# Patient Record
Sex: Male | Born: 1965 | Race: Black or African American | Hispanic: No | Marital: Single | Smoking: Never smoker
Health system: Southern US, Community
[De-identification: ages and names within clinical notes are randomized; demographics above are authoritative.]

## PROBLEM LIST (undated history)

## (undated) DIAGNOSIS — I10 Essential (primary) hypertension: Secondary | ICD-10-CM

## (undated) DIAGNOSIS — R9431 Abnormal electrocardiogram [ECG] [EKG]: Secondary | ICD-10-CM

## (undated) DIAGNOSIS — N183 Chronic kidney disease, stage 3 unspecified: Secondary | ICD-10-CM

## (undated) HISTORY — DX: Essential (primary) hypertension: I10

## (undated) HISTORY — DX: Chronic kidney disease, stage 3 unspecified: N18.30

## (undated) HISTORY — DX: Chronic kidney disease, stage 3 (moderate): N18.3

## (undated) HISTORY — DX: Abnormal electrocardiogram (ECG) (EKG): R94.31

---

## 2011-06-27 ENCOUNTER — Encounter (HOSPITAL_BASED_OUTPATIENT_CLINIC_OR_DEPARTMENT_OTHER): Payer: Self-pay | Admitting: *Deleted

## 2011-06-27 ENCOUNTER — Emergency Department (INDEPENDENT_AMBULATORY_CARE_PROVIDER_SITE_OTHER): Payer: Self-pay

## 2011-06-27 ENCOUNTER — Emergency Department (HOSPITAL_BASED_OUTPATIENT_CLINIC_OR_DEPARTMENT_OTHER)
Admission: EM | Admit: 2011-06-27 | Discharge: 2011-06-27 | Disposition: A | Discharge status: Home / Self Care | Payer: Self-pay | Attending: Emergency Medicine | Admitting: Emergency Medicine

## 2011-06-27 ENCOUNTER — Other Ambulatory Visit: Payer: Self-pay

## 2011-06-27 DIAGNOSIS — R002 Palpitations: Secondary | ICD-10-CM

## 2011-06-27 LAB — DIFFERENTIAL
Basophils Absolute: 0 10*3/uL (ref 0.0–0.1)
Lymphocytes Relative: 26 % (ref 12–46)
Lymphs Abs: 1.4 10*3/uL (ref 0.7–4.0)
Monocytes Absolute: 0.4 10*3/uL (ref 0.1–1.0)
Neutro Abs: 3.4 10*3/uL (ref 1.7–7.7)

## 2011-06-27 LAB — CBC
HCT: 41.5 % (ref 39.0–52.0)
RBC: 5.86 MIL/uL — ABNORMAL HIGH (ref 4.22–5.81)
RDW: 15.9 % — ABNORMAL HIGH (ref 11.5–15.5)
WBC: 5.3 10*3/uL (ref 4.0–10.5)

## 2011-06-27 LAB — COMPREHENSIVE METABOLIC PANEL
ALT: 21 U/L (ref 0–53)
AST: 28 U/L (ref 0–37)
CO2: 28 mEq/L (ref 19–32)
Chloride: 100 mEq/L (ref 96–112)
Creatinine, Ser: 1.4 mg/dL — ABNORMAL HIGH (ref 0.50–1.35)
GFR calc non Af Amer: 60 mL/min — ABNORMAL LOW (ref >90)
Glucose, Bld: 96 mg/dL (ref 70–99)
Sodium: 137 mEq/L (ref 135–145)
Total Bilirubin: 0.6 mg/dL (ref 0.3–1.2)

## 2011-06-27 LAB — CARDIAC PANEL(CRET KIN+CKTOT+MB+TROPI): Total CK: 289 U/L — ABNORMAL HIGH (ref 7–232)

## 2011-06-27 NOTE — ED Provider Notes (Signed)
History     CSN: [id number]  Arrival date & time 06/27/11  1704   First MD Initiated Contact with Patient 06/27/11 1737      No chief complaint on file.   (Consider location/radiation/quality/duration/timing/severity/associated sxs/prior treatment) Patient is a 46 y.o. male presenting with palpitations. The history is provided by the patient. No language interpreter was used.  Palpitations  This is a recurrent problem. The current episode started 2 days ago. The problem occurs constantly. The problem has not changed since onset.The problem is associated with an unknown factor. Associated symptoms include irregular heartbeat. Pertinent negatives include no numbness, no chest pain, no chest pressure and no exertional chest pressure. He has tried nothing for the symptoms. Risk factors include no known risk factors. His past medical history does not include hyperthyroidism.  Pt reports he heart was racing on Saturday.  Pt reports he had a similar episode a month ago.  Pt reports no problem today.  Pt reports he was thinking about Saturdays episode and decided he should get checked out.  History reviewed. No pertinent past medical history.  History reviewed. No pertinent past surgical history.  History reviewed. No pertinent family history.  History  Substance Use Topics  . Smoking status: Never Smoker   . Smokeless tobacco: Not on file  . Alcohol Use: Yes     occ      Review of Systems  Cardiovascular: Positive for palpitations. Negative for chest pain.  Neurological: Negative for numbness.  All other systems reviewed and are negative.    Allergies  Review of patient's allergies indicates no known allergies.  Home Medications  No current outpatient prescriptions on file.  BP 169/119  Temp(Src) 98.6 F (37 C) (Oral)  Resp 18  Wt 238 lb (107.956 kg)  SpO2 99%  Physical Exam  Nursing note and vitals reviewed. Constitutional: He is oriented to person, place, and time.  He appears well-developed and well-nourished.  HENT:  Head: Normocephalic and atraumatic.  Right Ear: External ear normal.  Left Ear: External ear normal.  Nose: Nose normal.  Mouth/Throat: Oropharynx is clear and moist.  Eyes: Conjunctivae and EOM are normal. Pupils are equal, round, and reactive to light.  Neck: Normal range of motion. Neck supple.  Cardiovascular: Normal rate and normal heart sounds.   Pulmonary/Chest: Effort normal and breath sounds normal.  Abdominal: Soft.  Musculoskeletal: Normal range of motion.  Neurological: He is alert and oriented to person, place, and time. He has normal reflexes.  Skin: Skin is warm.  Psychiatric: He has a normal mood and affect.    ED Course  Procedures (including critical care time)  Labs Reviewed  CBC - Abnormal; Notable for the following:    RBC 5.86 (*)    MCV 70.8 (*)    MCH 23.5 (*)    RDW 15.9 (*)    All other components within normal limits  COMPREHENSIVE METABOLIC PANEL - Abnormal; Notable for the following:    Creatinine, Ser 1.40 (*)    GFR calc non Af Amer 60 (*)    GFR calc Af Amer 70 (*)    All other components within normal limits  CARDIAC PANEL(CRET KIN+CKTOT+MB+TROPI) - Abnormal; Notable for the following:    Total CK 289 (*)    CK, MB 4.9 (*)    All other components within normal limits  DIFFERENTIAL   Dg Chest 2 View  06/27/2011  *RADIOLOGY REPORT*  Clinical Data: Palpitations.  CHEST - 2 VIEW  Comparison: None.  Findings: The heart size is normal.  The lungs are clear.  The visualized soft tissues and bony thorax are unremarkable.  IMPRESSION: No acute cardiopulmonary disease.  Original Report Authenticated By: Jamesetta Orleans. MATTERN, M.D.     No diagnosis found.    MDM  Pt observed on monitor x 30 minutes,  Occasional pvc.     Results for orders placed during the hospital encounter of 06/27/11  CBC      Component Value Range   WBC 5.3  4.0 - 10.5 (K/uL)   RBC 5.86 (*) 4.22 - 5.81 (MIL/uL)    Hemoglobin 13.8  13.0 - 17.0 (g/dL)   HCT 40.9  81.1 - 91.4 (%)   MCV 70.8 (*) 78.0 - 100.0 (fL)   MCH 23.5 (*) 26.0 - 34.0 (pg)   MCHC 33.3  30.0 - 36.0 (g/dL)   RDW 78.2 (*) 95.6 - 15.5 (%)   Platelets 273  150 - 400 (K/uL)  DIFFERENTIAL      Component Value Range   Neutrophils Relative 64  43 - 77 (%)   Neutro Abs 3.4  1.7 - 7.7 (K/uL)   Lymphocytes Relative 26  12 - 46 (%)   Lymphs Abs 1.4  0.7 - 4.0 (K/uL)   Monocytes Relative 8  3 - 12 (%)   Monocytes Absolute 0.4  0.1 - 1.0 (K/uL)   Eosinophils Relative 2  0 - 5 (%)   Eosinophils Absolute 0.1  0.0 - 0.7 (K/uL)   Basophils Relative 1  0 - 1 (%)   Basophils Absolute 0.0  0.0 - 0.1 (K/uL)  COMPREHENSIVE METABOLIC PANEL      Component Value Range   Sodium 137  135 - 145 (mEq/L)   Potassium 3.7  3.5 - 5.1 (mEq/L)   Chloride 100  96 - 112 (mEq/L)   CO2 28  19 - 32 (mEq/L)   Glucose, Bld 96  70 - 99 (mg/dL)   BUN 16  6 - 23 (mg/dL)   Creatinine, Ser 2.13 (*) 0.50 - 1.35 (mg/dL)   Calcium 9.6  8.4 - 08.6 (mg/dL)   Total Protein 8.0  6.0 - 8.3 (g/dL)   Albumin 4.3  3.5 - 5.2 (g/dL)   AST 28  0 - 37 (U/L)   ALT 21  0 - 53 (U/L)   Alkaline Phosphatase 80  39 - 117 (U/L)   Total Bilirubin 0.6  0.3 - 1.2 (mg/dL)   GFR calc non Af Amer 60 (*) >90 (mL/min)   GFR calc Af Amer 70 (*) >90 (mL/min)  CARDIAC PANEL(CRET KIN+CKTOT+MB+TROPI)      Component Value Range   Total CK 289 (*) 7 - 232 (U/L)   CK, MB 4.9 (*) 0.3 - 4.0 (ng/mL)   Troponin I <0.30  <0.30 (ng/mL)   Relative Index 1.7  0.0 - 2.5    Dg Chest 2 View  06/27/2011  *RADIOLOGY REPORT*  Clinical Data: Palpitations.  CHEST - 2 VIEW  Comparison: None.  Findings: The heart size is normal.  The lungs are clear.  The visualized soft tissues and bony thorax are unremarkable.  IMPRESSION: No acute cardiopulmonary disease.  Original Report Authenticated By: Jamesetta Orleans. MATTERN, M.D.    Date: 06/27/2011  Rate: 75  Rhythm: normal sinus rhythm  QRS Axis: normal  Intervals:  normal  ST/T Wave abnormalities: normal  Conduction Disutrbances:none  Narrative Interpretation:   Old EKG Reviewed: none available      Langston Masker, [place] 06/27/11 [phone]

## 2011-06-27 NOTE — ED Provider Notes (Signed)
Medical screening examination/treatment/procedure(s) were performed by non-physician practitioner and as supervising physician I was immediately available for consultation/collaboration.   Rolan Bucco, MD 06/27/11 2312

## 2013-07-16 IMAGING — CR DG CHEST 2V
2 series · 2 of 2 positions shown · non-contrast
Comparison: None.

CLINICAL DATA: Palpitations.

CHEST - 2 VIEW

[w chest pa]
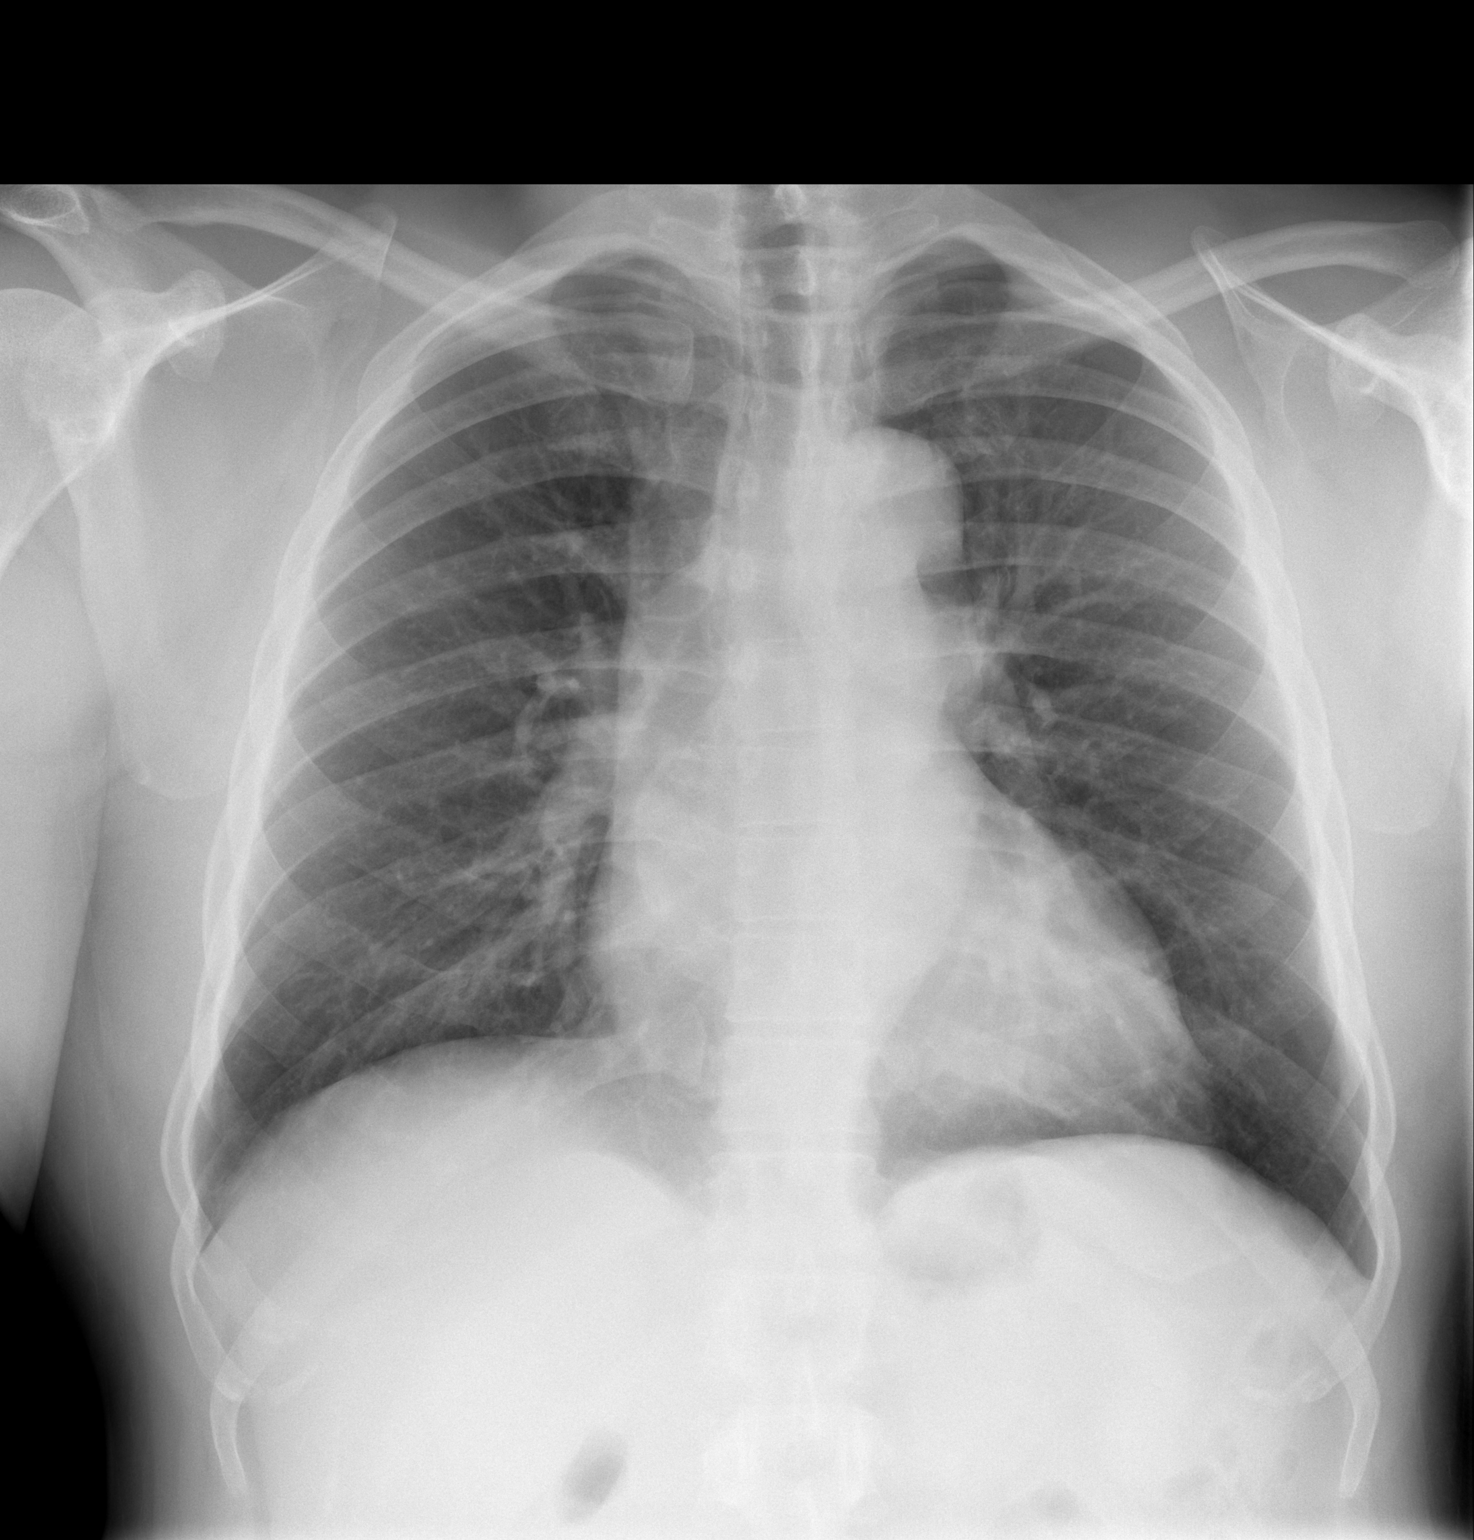

[w chest lat]
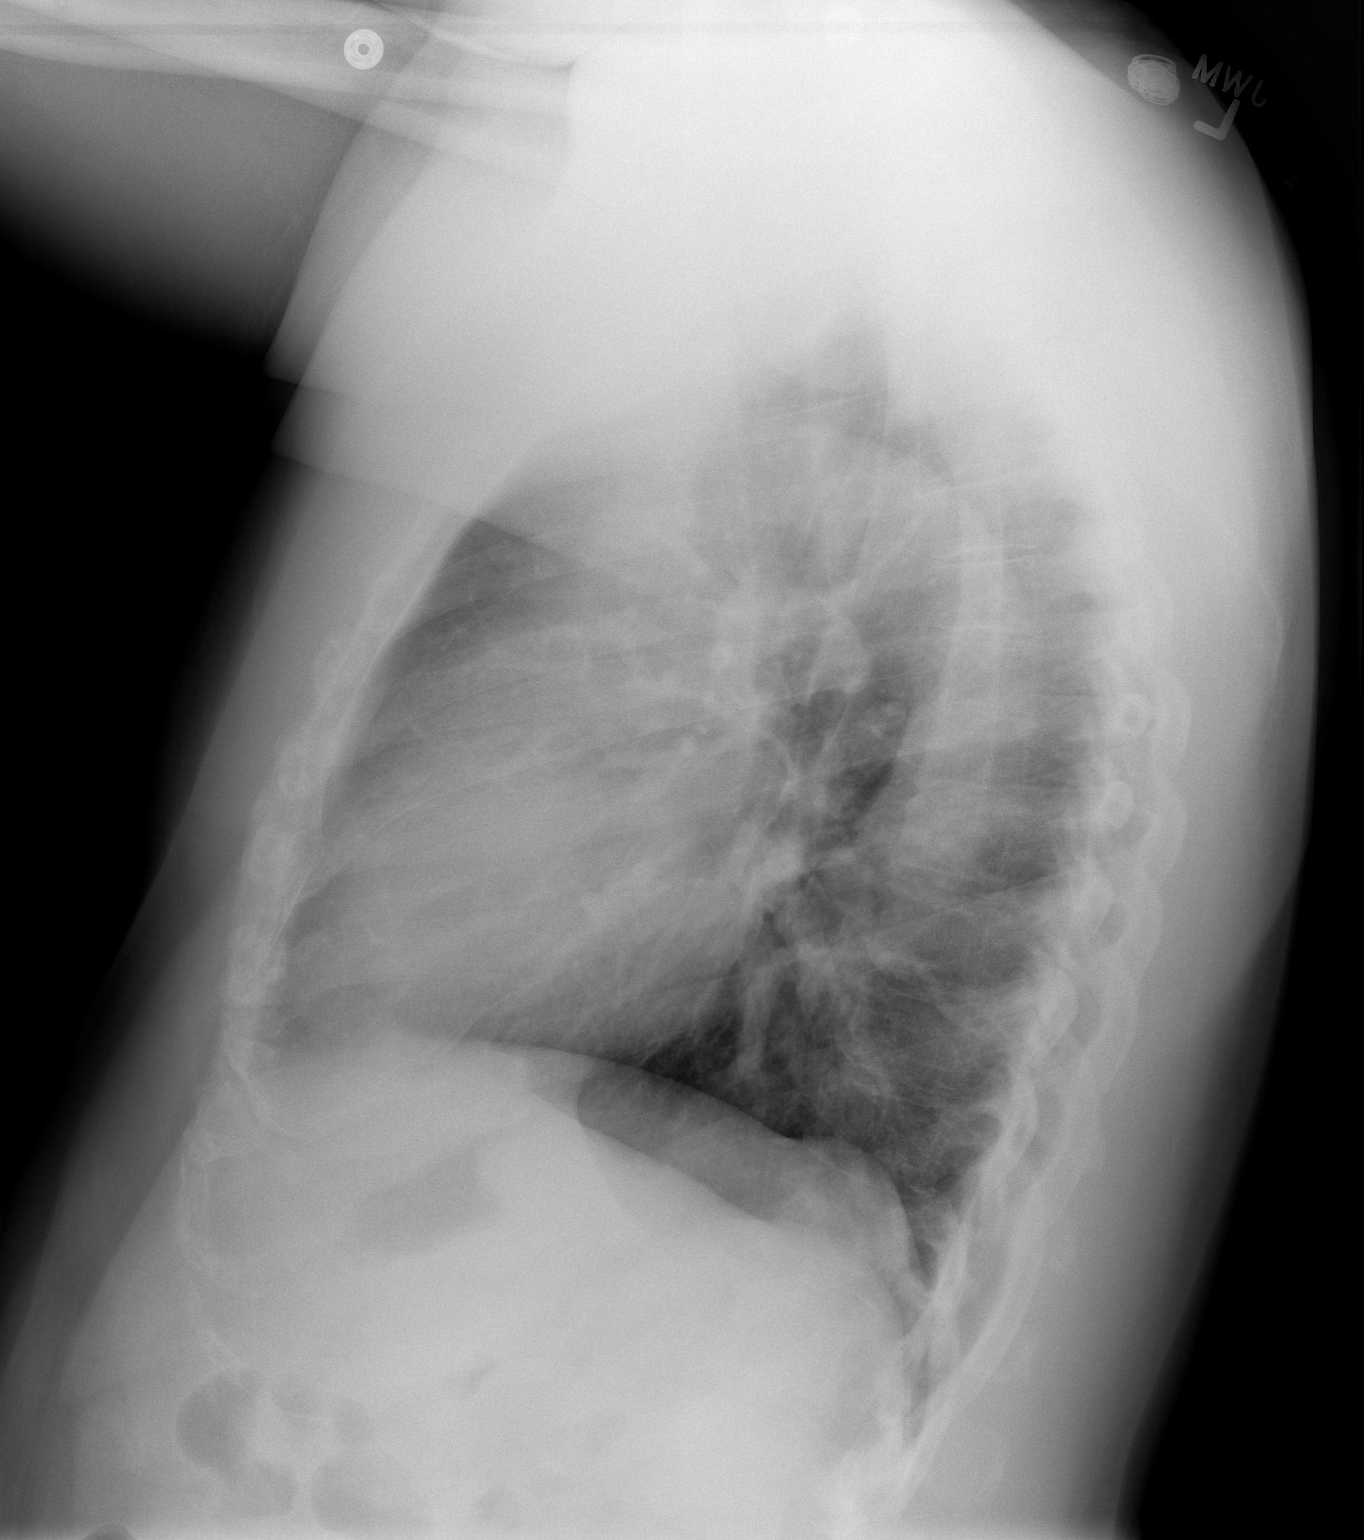

[2 of 2 positions shown; findings below may reference images not displayed]

FINDINGS: The heart size is normal.  The lungs are clear.  The
visualized soft tissues and bony thorax are unremarkable.
IMPRESSION: No acute cardiopulmonary disease.

## 2014-05-02 ENCOUNTER — Ambulatory Visit: Payer: Self-pay | Admitting: Interventional Cardiology

## 2014-05-06 ENCOUNTER — Ambulatory Visit (INDEPENDENT_AMBULATORY_CARE_PROVIDER_SITE_OTHER): Payer: No Typology Code available for payment source | Admitting: Cardiology

## 2014-05-06 ENCOUNTER — Ambulatory Visit: Payer: Self-pay | Admitting: Cardiology

## 2014-05-06 ENCOUNTER — Encounter: Payer: Self-pay | Admitting: Cardiology

## 2014-05-06 VITALS — BP 152/82 | HR 72 | Ht 69.5 in | Wt 234.0 lb

## 2014-05-06 DIAGNOSIS — R9431 Abnormal electrocardiogram [ECG] [EKG]: Secondary | ICD-10-CM

## 2014-05-06 DIAGNOSIS — N189 Chronic kidney disease, unspecified: Secondary | ICD-10-CM | POA: Insufficient documentation

## 2014-05-06 DIAGNOSIS — I10 Essential (primary) hypertension: Secondary | ICD-10-CM

## 2014-05-06 DIAGNOSIS — R0602 Shortness of breath: Secondary | ICD-10-CM

## 2014-05-06 DIAGNOSIS — R079 Chest pain, unspecified: Secondary | ICD-10-CM | POA: Insufficient documentation

## 2014-05-06 MED ORDER — AMLODIPINE BESYLATE 10 MG PO TABS: 10.0000 mg | ORAL_TABLET | Freq: Every day | ORAL | Status: AC

## 2014-05-06 MED ORDER — AMLODIPINE BESYLATE 10 MG PO TABS: 10.0000 mg | ORAL_TABLET | Freq: Every day | ORAL | Status: DC

## 2014-05-06 NOTE — Progress Notes (Signed)
[address] [place], [place]  [address] Phone: [phone] Fax:  [phone]  Date:  05/06/2014   ID:  Justin Mccann, DOB June 18, 1965, MRN [id number]  PCP:  Gretel AcreNNODI, ADAKU, MD  Cardiologist:  Armanda Magicraci Turner, MD    History of Present Illness: Justin Mccann is a 48 y.o. male with a history of HTN and abnormal EKG who presents today for evaluation.  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  He feels these symptoms also when his  BP is elevated and EKG showed T wave inversions in the lateral leads.  He is now referred for further evaluation.  He denies any  LE edema, dizziness or syncope.  He occasoinally will have some palpitations.  He says that he snores but never had any witnessed apnea.  He does have some nonrestorative sleep but a lot of times only gets 4 hours of sleep.     Wt Readings from Last 3 Encounters:  05/06/14 234 lb (106.142 kg)  06/27/11 238 lb (107.956 kg)     Past Medical History  Diagnosis Date  . Hypertension   . T wave inversion in EKG   . Chronic kidney disease (CKD), stage III (moderate)     Current Outpatient Prescriptions  Medication Sig Dispense Refill  . amLODipine (NORVASC) 5 MG tablet Take 5 mg by mouth daily.    . Beet Root POWD by Does not apply route. Super beet concentrated beetroot crystals  By mouth daily    . hydrochlorothiazide (HYDRODIURIL) 25 MG tablet Take 25 mg by mouth daily.    . Omega-3 Fatty Acids (FISH OIL PO) Take by mouth.     No current facility-administered medications for this visit.    Allergies:   No Known Allergies  Social History:  The patient  reports that he has never smoked. He does not have any smokeless tobacco history on file. He reports that he drinks alcohol. He reports that he does not use illicit drugs.   Family History:  The patient's family history includes Hypertension in his mother.    ROS:  Please see the history of present illness.      All other systems reviewed and negative.   PHYSICAL EXAM: VS:  BP 152/82 mmHg  Pulse 72  Ht 5' 9.5" (1.765 m)  Wt 234 lb (106.142 kg)  BMI 34.07 kg/m2  SpO2 94% Well nourished, well developed, in no acute distress HEENT: normal Neck: no JVD Cardiac:  normal S1, S2; RRR; no murmur Lungs:  clear to auscultation bilaterally, no wheezing, rhonchi or rales Abd: soft, nontender, no hepatomegaly Ext: no edema Skin: warm and dry Neuro:  CNs 2-12 intact, no focal abnormalities noted  EKG:  NSR with T wave inversions in I, aVL, V5 and V6     ASSESSMENT AND PLAN:  1. Chest pain that mainly occurs in the setting of elevated BPs.  This could be ischemic related given T wave changes on EKG but these same changes can be seen in LVH with repolarization so the chest pain could also be due to subendocardial ischemia from poorly controlled HTN.  I will check a stress myoview to rule out ischemia.   2. HTN - still with borderline control.  Increase amlodipine to 10 mg daily.  I have asked him to check his BP daily for a week and call with the results.  Continue HCTZ 3. SOB ? Related to uncontrolled HTN vs. Coronary ischemia.  I will check an echo to evaluate LVF  Followup with me in 3 weeks  Signed, Armanda Magicraci Turner, MD [facility] 05/06/2014 4:21 PM

## 2014-05-06 NOTE — Patient Instructions (Addendum)
Your physician has requested that you have an echocardiogram. Echocardiography is a painless test that uses sound waves to create images of your heart. It provides your doctor with information about the size and shape of your heart and how well your heart's chambers and valves are working. This procedure takes approximately one hour. There are no restrictions for this procedure.  Your physician has requested that you have a STRESS myoview. For further information please visit [email or web address]. Please follow instruction sheet, as given.  Your physician has recommended you make the following change in your medication:  1) INCREASE amlodipine to 10 mg daily  Please check your blood pressure daily for one week and call [phone] with results.   Your physician recommends that you schedule a follow-up appointment in: 4 weeks with Dr. Mayford Knifeurner.

## 2014-05-13 ENCOUNTER — Ambulatory Visit (HOSPITAL_BASED_OUTPATIENT_CLINIC_OR_DEPARTMENT_OTHER): Payer: No Typology Code available for payment source | Admitting: Radiology

## 2014-05-13 ENCOUNTER — Ambulatory Visit (HOSPITAL_COMMUNITY): Payer: No Typology Code available for payment source | Attending: Cardiology | Admitting: Radiology

## 2014-05-13 DIAGNOSIS — R079 Chest pain, unspecified: Secondary | ICD-10-CM | POA: Diagnosis not present

## 2014-05-13 DIAGNOSIS — R9431 Abnormal electrocardiogram [ECG] [EKG]: Secondary | ICD-10-CM | POA: Diagnosis not present

## 2014-05-13 DIAGNOSIS — R0602 Shortness of breath: Secondary | ICD-10-CM

## 2014-05-13 MED ORDER — TECHNETIUM TC 99M SESTAMIBI GENERIC - CARDIOLITE
10.0000 | Freq: Once | INTRAVENOUS | Status: AC | PRN
  Administered 2014-05-13: 10 via INTRAVENOUS

## 2014-05-13 MED ORDER — REGADENOSON 0.4 MG/5ML IV SOLN
0.4000 mg | Freq: Once | INTRAVENOUS | Status: AC
  Administered 2014-05-13: 0.4 mg via INTRAVENOUS

## 2014-05-13 MED ORDER — TECHNETIUM TC 99M SESTAMIBI GENERIC - CARDIOLITE
30.0000 | Freq: Once | INTRAVENOUS | Status: AC | PRN
  Administered 2014-05-13: 30 via INTRAVENOUS

## 2014-05-13 NOTE — Progress Notes (Signed)
[facility] SITE 3 NUCLEAR MED [address] [place], [place] [address] [phone]    Cardiology Nuclear Med Study  Lyndee Hensenugene Wheller is a 48 y.o. male     MRN : [id number]     DOB: Oct 10, 1965  Procedure Date: 05/13/2014  Nuclear Med Background Indication for Stress Test:  Evaluation for Ischemia and Abnormal EKG History:  n/a Cardiac Risk Factors: Hypertension  Symptoms:  Chest Pain, DOE and Palpitations   Nuclear Pre-Procedure Caffeine/Decaff Intake:  None> 12 hrs NPO After: 10:00pm   Lungs:  clear O2 Sat: 97% on room air. IV 0.9% NS with Angio Cath:  20g  IV Site: R Hand x 1, tolerated well IV Started by:  Irean HongPatsy Edwards, RN  Chest Size (in):  44 Cup Size: n/a  Height: 5' 9.5" (1.765 m)  Weight:  235 lb (106.595 kg)  BMI:  Body mass index is 34.22 kg/(m^2). Tech Comments:  Patient took Norvasc and Hctz this am. Irean HongPatsy Edwards, RN.    Nuclear Med Study 1 or 2 day study: 1 day  Stress Test Type:  Adenosine  Reading MD: N/A  Order Authorizing Provider:  Armanda Magicraci Turner, MD  Resting Radionuclide: Technetium 253m Sestamibi  Resting Radionuclide Dose: 11.0 mCi   Stress Radionuclide:  Technetium 523m Sestamibi  Stress Radionuclide Dose: 33.0 mCi           Stress Protocol Rest HR: 63 Stress HR: 83  Rest BP: 147/106 Stress BP: 159/105  Exercise Time (min): n/a METS: n/a   Predicted Max HR: 174 bpm % Max HR: 47.7 bpm Rate Pressure Product: [id number]   Dose of Adenosine (mg):  n/a Dose of Lexiscan: 0.4 mg  Dose of Atropine (mg): n/a Dose of Dobutamine: n/a mcg/kg/min (at max HR)  Stress Test Technologist: Milana NaSabrina Williams, EMT-P  Nuclear Technologist:  Kerby NoraElzbieta Kubak, CNMT     Rest Procedure:  Myocardial perfusion imaging was performed at rest 45 minutes following the intravenous administration of Technetium 513m Sestamibi. Rest ECG: NSR, cannot R/O prior septal MI; nonspecific ST changes.  Stress Procedure:  The patient received IV Lexiscan 0.4 mg over 15-seconds.   Technetium 833m Sestamibi injected at 30-seconds. This patient had sob, and an odd  Quantitative spect images were obtained after a 45 minute delay. Stress ECG: No diagnostic ST changes.  QPS Raw Data Images:  There is interference from nuclear activity from structures below the diaphragm. This does not affect the ability to read the study. LVE Stress Images:  Normal homogeneous uptake in all areas of the myocardium. Rest Images:  Normal homogeneous uptake in all areas of the myocardium. Subtraction (SDS):  No evidence of ischemia. Transient Ischemic Dilatation (Normal <1.22):  1.01 Lung/Heart Ratio (Normal <0.45):  0.25  Quantitative Gated Spect Images QGS EDV:  187 ml QGS ESV:  86 ml  Impression Exercise Capacity:  Lexiscan with no exercise. BP Response:  Normal blood pressure response. Clinical Symptoms:  There is dyspnea. ECG Impression:  No significant ST segment change suggestive of ischemia. Comparison with Prior Nuclear Study: No previous nuclear study performed  Overall Impression:  Normal stress nuclear study.  LV Ejection Fraction: 54%.  LV Wall Motion:  NL LV Function; NL Wall Motion  Olga MillersBrian Crenshaw

## 2014-05-13 NOTE — Progress Notes (Signed)
Echocardiogram performed.  

## 2014-05-20 ENCOUNTER — Telehealth: Payer: Self-pay | Admitting: Cardiology

## 2014-05-20 NOTE — Telephone Encounter (Signed)
To Dr. Turner for review and recommendations. 

## 2014-05-20 NOTE — Telephone Encounter (Signed)
New Msg    Pt calling to report BP readings,   today 153/96    Saturday 165/99,   Friday 151/101,  Thursday 150/99,   Wednesday 167/101.

## 2014-05-26 NOTE — Telephone Encounter (Signed)
BP still too high - Start Toprol XL 25mg  daily and check BP daily for a week and call with results

## 2014-05-26 NOTE — Telephone Encounter (Signed)
Left message to call back  

## 2014-05-27 MED ORDER — METOPROLOL SUCCINATE ER 25 MG PO TB24: 25.0000 mg | ORAL_TABLET | Freq: Every day | ORAL | Status: DC

## 2014-05-27 NOTE — Telephone Encounter (Signed)
Follow up     [facility]

## 2014-05-27 NOTE — Telephone Encounter (Signed)
Instructed patient to START Toprol 25 mg daily and check BP daily for a week and call with results.  Patient agrees with treatment plan.

## 2014-05-29 NOTE — Telephone Encounter (Signed)
Verified with patient he is NOT taking Toprol. Verified he INCREASED labetalol to 200 mg BID.  Med list updated.

## 2014-06-04 ENCOUNTER — Ambulatory Visit (INDEPENDENT_AMBULATORY_CARE_PROVIDER_SITE_OTHER): Payer: No Typology Code available for payment source | Admitting: Cardiology

## 2014-06-04 ENCOUNTER — Encounter: Payer: Self-pay | Admitting: Cardiology

## 2014-06-04 VITALS — BP 150/90 | HR 66 | Ht 69.0 in | Wt 239.0 lb

## 2014-06-04 DIAGNOSIS — I10 Essential (primary) hypertension: Secondary | ICD-10-CM

## 2014-06-04 DIAGNOSIS — R079 Chest pain, unspecified: Secondary | ICD-10-CM

## 2014-06-04 DIAGNOSIS — R0602 Shortness of breath: Secondary | ICD-10-CM

## 2014-06-04 MED ORDER — LABETALOL HCL 300 MG PO TABS: 300.0000 mg | ORAL_TABLET | Freq: Two times a day (BID) | ORAL | Status: AC

## 2014-06-04 NOTE — Patient Instructions (Signed)
Your physician has recommended you make the following change in your medication:  1) INCREASE Labetalol to 300 mg twice a day  Check your blood pressure daily for one week and call us with results. [phone].  Your physician recommends that you schedule a follow-up appointment in: 3 months with Dr. Mayford Knifeurner.

## 2014-06-04 NOTE — Progress Notes (Signed)
[address] [place], [place]  [address] Phone: [phone] Fax:  [phone]  Date:  06/04/2014   ID:  Justin Mccann, DOB 08-29-1965, MRN [id number]  PCP:  Gretel Acre, MD  Cardiologist:  Armanda Magic, MD    History of Present Illness: Justin Mccann is a 49 y.o. male with a history of HTN and abnormal EKG who presents today for followup. 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 He feet these symptoms also when his BP was elevated and EKG showed T wave inversions in the lateral leads. He also complained  that he snores but never had any witnessed apnea. He has nonrestorative sleep but a lot of times only gets 4 hours of sleep. 2D echo showed normal LVF with moderate LVH and stress test showed no ischemia.  It was felt that his abnormal EKG was due to LVH from HTN.  Since I saw him last his renal MD put him on labetolol and increased the dose to  BID.  He noticed a significant improvement in his DOE and has not had nay chest discomfort.  He denies any LE edema, dizziness or syncope.     Wt Readings from Last 3 Encounters:  06/04/14 239 lb (108.41 kg)  05/13/14 235 lb (106.595 kg)  05/06/14 234 lb (106.142 kg)     Past Medical History  Diagnosis Date  . Hypertension   . T wave inversion in EKG   . Chronic kidney disease (CKD), stage III (moderate)     Current Outpatient Prescriptions  Medication Sig Dispense Refill  . amLODipine (NORVASC) 10 MG tablet Take 1 tablet (10 mg total) by mouth daily. 30 tablet 6  . Beet Root POWD by Does not apply route. Super beet concentrated beetroot crystals  By mouth daily    . Flaxseed, Linseed, (FLAX SEED OIL PO) Take by mouth daily.    . hydrochlorothiazide (HYDRODIURIL) 25 MG tablet Take 25 mg by mouth daily.    Marland Kitchen labetalol (NORMODYNE) 100 MG tablet Take 200 mg by mouth 2 (two) times daily.      No current  facility-administered medications for this visit.    Allergies:   No Known Allergies  Social History:  The patient  reports that he has never smoked. He does not have any smokeless tobacco history on file. He reports that he drinks alcohol. He reports that he does not use illicit drugs.   Family History:  The patient's family history includes Hypertension in his mother.   ROS:  Please see the history of present illness.      All other systems reviewed and negative.   PHYSICAL EXAM: VS:  BP 150/90 mmHg  Pulse 66  Ht  (1.753 m)  Wt 239 lb (108.41 kg)  BMI 35.28 kg/m2 Well nourished, well developed, in no acute distress HEENT: normal Neck: no JVD Cardiac:  normal S1, S2; RRR; no murmur Lungs:  clear to auscultation bilaterally, no wheezing, rhonchi or rales Abd: soft, nontender, no hepatomegaly Ext: no edema Skin: warm and dry Neuro:  CNs 2-12 intact, no focal abnormalities noted     ASSESSMENT AND PLAN:  1. Chest pain that mainly occurs in the setting of elevated BPs.  Stress myoview showed no evidence of ischemia. This is most likely due to poorly controlled HTN.  He had moderate LVH on echo.  This has resolved  on Labetolol.   2. HTN - still with borderline control. Continue amlodipine and HCTZ.  Increase Labetolol to 300mg  BID.  Check BP daily for a week and call with results. 3. SOB ? Related to uncontrolled HTN.  2D echo with normal LVF.  SOB has significantly improved on BB>  Followup with me in 3 months   Signed, Armanda Magicraci Turner, MD [facility] 06/04/2014 11:35 AM

## 2014-08-19 NOTE — Progress Notes (Signed)
Cardiology Office Note   Date:  08/20/2014   ID:  Justin Mccann, DOB 29-Dec-1965, MRN [id number]  PCP:  Gretel Acre, MD  Cardiologist:   Quintella Reichert, MD   Chief Complaint  Patient presents with  . Shortness of Breath  . Hypertension      History of Present Illness: Justin Mccann is a 49 y.o. male with a history of HTN and abnormal EKG who presents today for followup. 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 2D echo showed normal LVF with moderate LVH and stress test showed no ischemia. It was felt that his abnormal EKG was due to LVH from HTN.His renal MD put him on labetolol and increased the dose to  BID. He noticed a significant improvement in his DOE and has not had any chest discomfort. He denies any LE edema, dizziness or syncope. His only complaint is that he cannot get his heart rate up with exercise and gets fatigued.     Past Medical History  Diagnosis Date  . Hypertension   . T wave inversion in EKG   . Chronic kidney disease (CKD), stage III (moderate)     No past surgical history on file.   Current Outpatient Prescriptions  Medication Sig Dispense Refill  . amLODipine (NORVASC) 10 MG tablet Take 1 tablet (10 mg total) by mouth daily. 30 tablet 6  . Beet Root POWD by Does not apply route. Super beet concentrated beetroot crystals  By mouth daily    . Flaxseed, Linseed, (FLAX SEED OIL PO) Take by mouth daily.    . hydrochlorothiazide (HYDRODIURIL) 25 MG tablet Take 25 mg by mouth daily.    Marland Kitchen labetalol (NORMODYNE) 300 MG tablet Take 1 tablet (300 mg total) by mouth 2 (two) times daily.    Marland Kitchen losartan (COZAAR) 25 MG tablet Take 25 mg by mouth daily.  3  . Vitamin D, Ergocalciferol, (DRISDOL) 50000 UNITS CAPS capsule Take 1 capsule by mouth once a week.  0   No current facility-administered medications for this visit.    Allergies:    Review of patient's allergies indicates no known allergies.    Social History:  The patient  reports that he has never smoked. He does not have any smokeless tobacco history on file. He reports that he drinks alcohol. He reports that he does not use illicit drugs.   Family History:  The patient's family history includes Hypertension in his mother.    ROS:  Please see the history of present illness.   Otherwise, review of systems are positive for none.   All other systems are reviewed and negative.    PHYSICAL EXAM: VS:  BP 110/72 mmHg  Pulse 68  Ht  (1.803 m)  Wt 231 lb 1.9 oz (104.835 kg)  BMI 32.25 kg/m2 , BMI Body mass index is 32.25 kg/(m^2). GEN: Well nourished, well developed, in no acute distress HEENT: normal Neck: no JVD, carotid bruits, or masses Cardiac: RRR; no murmurs, rubs, or gallops,no edema  Respiratory:  clear to auscultation bilaterally, normal work of breathing GI: soft, nontender, nondistended, + BS MS: no deformity or atrophy Skin: warm and dry, no rash Neuro:  Strength and sensation are intact Psych: euthymic mood, full affect   EKG:  EKG is not ordered today.   Recent Labs: No results found for requested labs within last 365 days.  Lipid Panel No results found for: CHOL, TRIG, HDL, CHOLHDL, VLDL, LDLCALC, LDLDIRECT    Wt Readings from Last 3 Encounters:  08/20/14 231 lb 1.9 oz (104.835 kg)  06/04/14 239 lb (108.41 kg)  05/13/14 235 lb (106.595 kg)    ASSESSMENT AND PLAN:  1. Chest pain that mainly occurs in the setting of elevated BPs. Stress myoview showed no ischemia. He has not had any further CP 2. HTN -  Very well controlled  - continue amlodipine/HCTZ/labetolol/losartan 3. SOB ? Related to uncontrolled HTN - 2Decho with normal LVF with moderate LVH.  SOB has resolved.   4. Fatigue with exercise most likely related to BB therapy.  His renal MD ordered the labetolol so I have asked him to check with her in regards to  this.     Current medicines are reviewed at length with the patient today.  The patient does not have concerns regarding medicines.  The following changes have been made:  no change  Labs/ tests ordered today include: none  No orders of the defined types were placed in this encounter.     Disposition:   FU with me in 1 year   Signed, Quintella ReichertURNER,Tenesia Escudero R, MD  08/20/2014 11:28 AM    [facility] [address], [place], [place]  [address] Phone: [phone](336) [phone]; Fax: [phone]

## 2014-08-20 ENCOUNTER — Encounter: Payer: Self-pay | Admitting: Cardiology

## 2014-08-20 ENCOUNTER — Ambulatory Visit (INDEPENDENT_AMBULATORY_CARE_PROVIDER_SITE_OTHER): Payer: No Typology Code available for payment source | Admitting: Cardiology

## 2014-08-20 VITALS — BP 110/72 | HR 68 | Ht 71.0 in | Wt 231.1 lb

## 2014-08-20 DIAGNOSIS — I10 Essential (primary) hypertension: Secondary | ICD-10-CM | POA: Diagnosis not present

## 2014-08-20 DIAGNOSIS — R079 Chest pain, unspecified: Secondary | ICD-10-CM | POA: Diagnosis not present

## 2014-08-20 DIAGNOSIS — R0602 Shortness of breath: Secondary | ICD-10-CM

## 2014-08-20 NOTE — Patient Instructions (Signed)
Your physician wants you to follow-up in: 1 year with Dr. Turner. You will receive a reminder letter in the mail two months in advance. If you don't receive a letter, please call our office to schedule the follow-up appointment.  

## 2021-07-26 DIAGNOSIS — N1831 Chronic kidney disease, stage 3a: Secondary | ICD-10-CM | POA: Diagnosis not present

## 2021-07-27 DIAGNOSIS — N1831 Chronic kidney disease, stage 3a: Secondary | ICD-10-CM | POA: Diagnosis not present

## 2021-07-27 DIAGNOSIS — Z6835 Body mass index (BMI) 35.0-35.9, adult: Secondary | ICD-10-CM | POA: Diagnosis not present

## 2021-07-27 DIAGNOSIS — D631 Anemia in chronic kidney disease: Secondary | ICD-10-CM | POA: Diagnosis not present

## 2021-07-27 DIAGNOSIS — E669 Obesity, unspecified: Secondary | ICD-10-CM | POA: Diagnosis not present

## 2021-07-27 DIAGNOSIS — I129 Hypertensive chronic kidney disease with stage 1 through stage 4 chronic kidney disease, or unspecified chronic kidney disease: Secondary | ICD-10-CM | POA: Diagnosis not present

## 2022-01-14 DIAGNOSIS — N1831 Chronic kidney disease, stage 3a: Secondary | ICD-10-CM | POA: Diagnosis not present

## 2022-01-18 DIAGNOSIS — E669 Obesity, unspecified: Secondary | ICD-10-CM | POA: Diagnosis not present

## 2022-01-18 DIAGNOSIS — D509 Iron deficiency anemia, unspecified: Secondary | ICD-10-CM | POA: Diagnosis not present

## 2022-01-18 DIAGNOSIS — Z6835 Body mass index (BMI) 35.0-35.9, adult: Secondary | ICD-10-CM | POA: Diagnosis not present

## 2022-01-18 DIAGNOSIS — I129 Hypertensive chronic kidney disease with stage 1 through stage 4 chronic kidney disease, or unspecified chronic kidney disease: Secondary | ICD-10-CM | POA: Diagnosis not present

## 2022-01-18 DIAGNOSIS — N1831 Chronic kidney disease, stage 3a: Secondary | ICD-10-CM | POA: Diagnosis not present

## 2023-03-22 ENCOUNTER — Ambulatory Visit
Admission: EM | Admit: 2023-03-22 | Discharge: 2023-03-22 | Disposition: A | Discharge status: Home / Self Care | Payer: 59 | Attending: Emergency Medicine | Admitting: Emergency Medicine

## 2023-03-22 DIAGNOSIS — J209 Acute bronchitis, unspecified: Secondary | ICD-10-CM | POA: Diagnosis not present

## 2023-03-22 DIAGNOSIS — J0100 Acute maxillary sinusitis, unspecified: Secondary | ICD-10-CM

## 2023-03-22 MED ORDER — AZITHROMYCIN 250 MG PO TABS: 250.0000 mg | ORAL_TABLET | Freq: Every day | ORAL | 0 refills | Status: DC

## 2023-03-22 MED ORDER — BENZONATATE 100 MG PO CAPS: 100.0000 mg | ORAL_CAPSULE | Freq: Three times a day (TID) | ORAL | 0 refills | Status: DC | PRN

## 2023-03-22 NOTE — ED Triage Notes (Signed)
Patient to Urgent Care with complaints of persistent cough and back pain.  Symptoms started two weeks ago. Reports mucus is dark in color. Feels a rattling in his chest.

## 2023-03-22 NOTE — Discharge Instructions (Addendum)
Take the Zithromax and Tessalon Perles as directed.    Follow up with your primary care provider if your symptoms are not improving.     

## 2023-03-22 NOTE — ED Provider Notes (Signed)
Justin Mccann    CSN: [id number] Arrival date & time: 03/22/23  1421      History   Chief Complaint Chief Complaint  Patient presents with   Back Pain   Cough    HPI Justin Mccann is a 57 y.o. male.  Patient presents with 2 week history of congestion and productive cough.  He reports pain in his back from coughing x 2 days.  He denies fever, shortness of breath, chest pain, or other symptoms.  No OTC medications today.  His medical history includes hypertension, CKD.  The history is provided by the patient and medical records.    Past Medical History:  Diagnosis Date   Chronic kidney disease (CKD), stage III (moderate) (HCC)    Hypertension    T wave inversion in EKG     Patient Active Problem List   Diagnosis Date Noted   Benign essential HTN 05/06/2014   Chest pain 05/06/2014   Abnormal EKG 05/06/2014   SOB (shortness of breath) 05/06/2014   Chronic kidney disease     History reviewed. No pertinent surgical history.     Home Medications    Prior to Admission medications   Medication Sig Start Date End Date Taking? Authorizing Provider  azithromycin (ZITHROMAX) 250 MG tablet Take 1 tablet (250 mg total) by mouth daily. Take first 2 tablets together, then 1 every day until finished. 03/22/23  Yes Mickie Bail, NP  benzonatate (TESSALON) 100 MG capsule Take 1 capsule (100 mg total) by mouth 3 (three) times daily as needed for cough. 03/22/23  Yes Mickie Bail, NP  amLODipine (NORVASC) 10 MG tablet Take 1 tablet (10 mg total) by mouth daily. 05/06/14   Quintella Reichert, MD  Beet Root POWD by Does not apply route. Super beet concentrated beetroot crystals  By mouth daily    [provider]  Flaxseed, Linseed, (FLAX SEED OIL PO) Take by mouth daily.    [provider]  hydrochlorothiazide (HYDRODIURIL) 25 MG tablet Take 25 mg by mouth daily.    [provider]  labetalol (NORMODYNE) 300 MG tablet Take 1 tablet (300 mg total) by  mouth 2 (two) times daily. 06/04/14   Quintella Reichert, MD  losartan (COZAAR) 25 MG tablet Take 25 mg by mouth daily. 08/07/14   [provider]  Vitamin D, Ergocalciferol, (DRISDOL) 50000 UNITS CAPS capsule Take 1 capsule by mouth once a week. 08/09/14   [provider]    Family History Family History  Problem Relation Age of Onset   Hypertension Mother     Social History Social History   Tobacco Use   Smoking status: Never  Substance Use Topics   Alcohol use: Yes    Comment: occ   Drug use: No     Allergies   Patient has no known allergies.   Review of Systems Review of Systems  Constitutional:  Negative for chills and fever.  HENT:  Positive for congestion. Negative for ear pain and sore throat.   Respiratory:  Positive for cough. Negative for shortness of breath.   Cardiovascular:  Negative for chest pain and palpitations.  All other systems reviewed and are negative.    Physical Exam Triage Vital Signs ED Triage Vitals  Encounter Vitals Group     BP 03/22/23 1435 119/77     Systolic BP Percentile --      Diastolic BP Percentile --      Pulse Rate 03/22/23 1435 74  Resp 03/22/23 1435 18     Temp 03/22/23 1435 98.4 F (36.9 C)     Temp src --      SpO2 03/22/23 1435 97 %     Weight --      Height --      Head Circumference --      Peak Flow --      Pain Score 03/22/23 1427 6     Pain Loc --      Pain Education --      Exclude from Growth Chart --    No data found.  Updated Vital Signs BP 119/77   Pulse 74   Temp 98.4 F (36.9 C)   Resp 18   SpO2 97%   Visual Acuity Right Eye Distance:   Left Eye Distance:   Bilateral Distance:    Right Eye Near:   Left Eye Near:    Bilateral Near:     Physical Exam Vitals and nursing note reviewed.  Constitutional:      General: He is not in acute distress.    Appearance: He is well-developed.  HENT:     Right Ear: Tympanic membrane normal.     Left Ear: Tympanic membrane normal.      Nose: Congestion present.     Mouth/Throat:     Mouth: Mucous membranes are moist.     Pharynx: Oropharynx is clear.  Cardiovascular:     Rate and Rhythm: Normal rate and regular rhythm.     Heart sounds: Normal heart sounds.  Pulmonary:     Effort: Pulmonary effort is normal. No respiratory distress.     Breath sounds: Normal breath sounds.  Musculoskeletal:     Cervical back: Neck supple.  Skin:    General: Skin is warm and dry.  Neurological:     Mental Status: He is alert.      UC Treatments / Results  Labs (all labs ordered are listed, but only abnormal results are displayed) Labs Reviewed - No data to display  EKG   Radiology No results found.  Procedures Procedures (including critical care time)  Medications Ordered in UC Medications - No data to display  Initial Impression / Assessment and Plan / UC Course  I have reviewed the triage vital signs and the nursing notes.  Pertinent labs & imaging results that were available during my care of the patient were reviewed by me and considered in my medical decision making (see chart for details).   Acute sinusitis, acute bronchitis.  Afebrile and vital signs are stable.  Patient has been symptomatic for 2 weeks.  Treating today with Zithromax and Tessalon Perles.  Education provided on sinus infection and bronchitis.  Instructed patient to follow-up with his PCP if he is not improving.  He agrees to plan of care.    Final Clinical Impressions(s) / UC Diagnoses   Final diagnoses:  Acute non-recurrent maxillary sinusitis  Acute bronchitis, unspecified organism     Discharge Instructions      Take the Zithromax and Tessalon Perles as directed.  Follow-up with your primary care provider if your symptoms are not improving.      ED Prescriptions     Medication Sig Dispense Auth. Provider   azithromycin (ZITHROMAX) 250 MG tablet Take 1 tablet (250 mg total) by mouth daily. Take first 2 tablets together,  then 1 every day until finished. 6 tablet Mickie Bail, NP   benzonatate (TESSALON) 100 MG capsule Take 1 capsule (100 mg  total) by mouth 3 (three) times daily as needed for cough. 21 capsule Mickie Bail, NP      PDMP not reviewed this encounter.   Mickie Bail, NP 03/22/23 1455

## 2024-04-15 ENCOUNTER — Encounter: Payer: Self-pay | Admitting: Emergency Medicine

## 2024-04-15 ENCOUNTER — Ambulatory Visit
Admission: EM | Admit: 2024-04-15 | Discharge: 2024-04-15 | Disposition: A | Discharge status: Home / Self Care | Payer: Self-pay | Attending: Emergency Medicine | Admitting: Emergency Medicine

## 2024-04-15 DIAGNOSIS — J069 Acute upper respiratory infection, unspecified: Secondary | ICD-10-CM

## 2024-04-15 MED ORDER — AZITHROMYCIN 250 MG PO TABS: 250.0000 mg | ORAL_TABLET | Freq: Every day | ORAL | 0 refills | Status: AC

## 2024-04-15 NOTE — ED Provider Notes (Signed)
 [name]    CSN: [id number] Arrival date & time: 04/15/24  1358      History   Chief Complaint Chief Complaint  Patient presents with   Cough   Nasal Congestion    HPI Justin Mccann is a 58 y.o. male.   Patient presents for evaluation of nasal congestion, chest congestion and a primarily nonproductive cough present for 7 days.  When cough is productive able to expel yellow sputum.  Has heard wheezing within the chest wall which typically initiates cough.  Has had intermittent ear fullness but denies presence of pain or drainage.  Known sick contacts prior.  Tolerable to food and liquids.  Has attempted use of Mucinex.  Denies respiratory history, non-smoker.    Past Medical History:  Diagnosis Date   Chronic kidney disease (CKD), stage III (moderate) (HCC)    Hypertension    T wave inversion in EKG     Patient Active Problem List   Diagnosis Date Noted   Benign essential HTN 05/06/2014   Chest pain 05/06/2014   Abnormal EKG 05/06/2014   SOB (shortness of breath) 05/06/2014   Chronic kidney disease     History reviewed. No pertinent surgical history.     Home Medications    Prior to Admission medications   Medication Sig Start Date End Date Taking? Authorizing Provider  amLODipine  (NORVASC ) 10 MG tablet Take 1 tablet (10 mg total) by mouth daily. 05/06/14   Shlomo Wilbert SAUNDERS, MD  azithromycin  (ZITHROMAX ) 250 MG tablet Take 1 tablet (250 mg total) by mouth daily. Take first 2 tablets together, then 1 every day until finished. 03/22/23   Corlis Burnard DEL, NP  Beet Root POWD by Does not apply route. Super beet concentrated beetroot crystals  By mouth daily    [provider]  benzonatate  (TESSALON ) 100 MG capsule Take 1 capsule (100 mg total) by mouth 3 (three) times daily as needed for cough. 03/22/23   Corlis Burnard DEL, NP  Flaxseed, Linseed, (FLAX SEED OIL PO) Take by mouth daily.    [provider]  hydrochlorothiazide (HYDRODIURIL) 25 MG  tablet Take 25 mg by mouth daily.    [provider]  labetalol  (NORMODYNE ) 300 MG tablet Take 1 tablet (300 mg total) by mouth 2 (two) times daily. 06/04/14   Shlomo Wilbert SAUNDERS, MD  losartan (COZAAR) 25 MG tablet Take 25 mg by mouth daily. 08/07/14   [provider]  Vitamin D, Ergocalciferol, (DRISDOL) 50000 UNITS CAPS capsule Take 1 capsule by mouth once a week. 08/09/14   [provider]    Family History Family History  Problem Relation Age of Onset   Hypertension Mother     Social History Social History   Tobacco Use   Smoking status: Never   Smokeless tobacco: Never  Substance Use Topics   Alcohol use: Yes    Comment: occ   Drug use: No     Allergies   Patient has no known allergies.   Review of Systems Review of Systems  Constitutional: Negative.   HENT:  Positive for congestion. Negative for dental problem, drooling, ear discharge, ear pain, facial swelling, hearing loss, mouth sores, nosebleeds, postnasal drip, rhinorrhea, sinus pressure, sinus pain, sneezing, sore throat, tinnitus, trouble swallowing and voice change.   Respiratory:  Positive for cough and wheezing. Negative for apnea, choking, chest tightness, shortness of breath and stridor.   Gastrointestinal: Negative.   Neurological: Negative.      Physical Exam Triage Vital Signs  ED Triage Vitals  Encounter Vitals Group     BP 04/15/24 1408 (!) 141/83     Girls Systolic BP Percentile --      Girls Diastolic BP Percentile --      Boys Systolic BP Percentile --      Boys Diastolic BP Percentile --      Pulse Rate 04/15/24 1408 83     Resp 04/15/24 1408 18     Temp 04/15/24 1408 97.7 F (36.5 C)     Temp Source 04/15/24 1408 Oral     SpO2 04/15/24 1408 97 %     Weight --      Height --      Head Circumference --      Peak Flow --      Pain Score 04/15/24 1409 0     Pain Loc --      Pain Education --      Exclude from Growth Chart --    No data found.  Updated Vital  Signs BP (!) 141/83 (BP Location: Left Arm)   Pulse 83   Temp 97.7 F (36.5 C) (Oral)   Resp 18   SpO2 97%   Visual Acuity Right Eye Distance:   Left Eye Distance:   Bilateral Distance:    Right Eye Near:   Left Eye Near:    Bilateral Near:     Physical Exam Constitutional:      Appearance: Normal appearance.  Eyes:     Extraocular Movements: Extraocular movements intact.  Cardiovascular:     Rate and Rhythm: Normal rate and regular rhythm.     Pulses: Normal pulses.     Heart sounds: Normal heart sounds.  Pulmonary:     Effort: Pulmonary effort is normal.     Breath sounds: Normal breath sounds.  Neurological:     Mental Status: He is alert and oriented to person, place, and time. Mental status is at baseline.      UC Treatments / Results  Labs (all labs ordered are listed, but only abnormal results are displayed) Labs Reviewed - No data to display  EKG   Radiology No results found.  Procedures Procedures (including critical care time)  Medications Ordered in UC Medications - No data to display  Initial Impression / Assessment and Plan / UC Course  I have reviewed the triage vital signs and the nursing notes.  Pertinent labs & imaging results that were available during my care of the patient were reviewed by me and considered in my medical decision making (see chart for details).  Acute URI  Patient is in no signs of distress nor toxic appearing.  Vital signs are stable.  Low suspicion for pneumonia, pneumothorax or bronchitis and therefore will defer imaging.  Viral testing deferred due to timeline.  Symptoms persisted for 7 days.  Would like azithromycin , declined cough medicine at this time.May use additional over-the-counter medications as needed for supportive care.  May follow-up with urgent care as needed if symptoms persist or worsen.   Final Clinical Impressions(s) / UC Diagnoses   Final diagnoses:  None   Discharge Instructions   None     ED Prescriptions   None    PDMP not reviewed this encounter.   Teresa Shelba SAUNDERS, NP 04/15/24 1420

## 2024-04-15 NOTE — ED Triage Notes (Signed)
 Patient reports cough, chest congestion and nasal congestion with yellow mucus for 1 week. Patient has taken Nyquil with mild relief.

## 2024-04-15 NOTE — Discharge Instructions (Signed)
 Begin azithromycin  as directed to get rid of any bacteria causing symptoms to persist or linger    You can take Tylenol and/or Ibuprofen as needed for fever reduction and pain relief.   For cough: honey 1/2 to 1 teaspoon (you can dilute the honey in water or another fluid).  You can also use guaifenesin and dextromethorphan for cough. You can use a humidifier for chest congestion and cough.  If you don't have a humidifier, you can sit in the bathroom with the hot shower running.      For sore throat: try warm salt water gargles, cepacol lozenges, throat spray, warm tea or water with lemon/honey, popsicles or ice, or OTC cold relief medicine for throat discomfort.   For congestion: take a daily anti-histamine like Zyrtec, Claritin, and a oral decongestant, such as pseudoephedrine.  You can also use Flonase 1-2 sprays in each nostril daily.   It is important to stay hydrated: drink plenty of fluids (water, gatorade/powerade/pedialyte, juices, or teas) to keep your throat moisturized and help further relieve irritation/discomfort.
